# Patient Record
Sex: Female | Born: 1945 | Race: Black or African American | Hispanic: No | Marital: Married
Health system: Southern US, Community
[De-identification: ages and names within clinical notes are randomized; demographics above are authoritative.]

---

## 1998-11-12 ENCOUNTER — Encounter: Payer: Self-pay | Admitting: Emergency Medicine

## 1998-11-12 ENCOUNTER — Emergency Department (HOSPITAL_COMMUNITY): Admission: EM | Admit: 1998-11-12 | Discharge: 1998-11-12 | Payer: Self-pay | Admitting: Emergency Medicine

## 2011-07-02 ENCOUNTER — Other Ambulatory Visit: Payer: Self-pay | Admitting: Internal Medicine

## 2011-07-02 DIAGNOSIS — S158XXA Injury of other specified blood vessels at neck level, initial encounter: Secondary | ICD-10-CM

## 2011-07-03 ENCOUNTER — Ambulatory Visit
Admission: RE | Admit: 2011-07-03 | Discharge: 2011-07-03 | Disposition: A | Discharge status: Home / Self Care | Payer: TRICARE For Life (TFL) | Source: Ambulatory Visit | Attending: Internal Medicine | Admitting: Internal Medicine

## 2011-07-03 DIAGNOSIS — S158XXA Injury of other specified blood vessels at neck level, initial encounter: Secondary | ICD-10-CM

## 2012-10-06 IMAGING — CT CT HEAD W/O CM
2 series · 16 of 30 positions shown, 18 images · non-contrast
Comparison: None.

CLINICAL DATA: Memory loss, prior history of fall, right parietal
headache

CT HEAD WITHOUT CONTRAST
TECHNIQUE: Contiguous axial images were obtained from the base of
the skull through the vertex without contrast.

[Series 2: head w/o · axial · non-contrast · 0.43mm/px · z∈[+17,+125]mm · 8 of 28 slices shown, 10 images]
[im 4/28  brain]
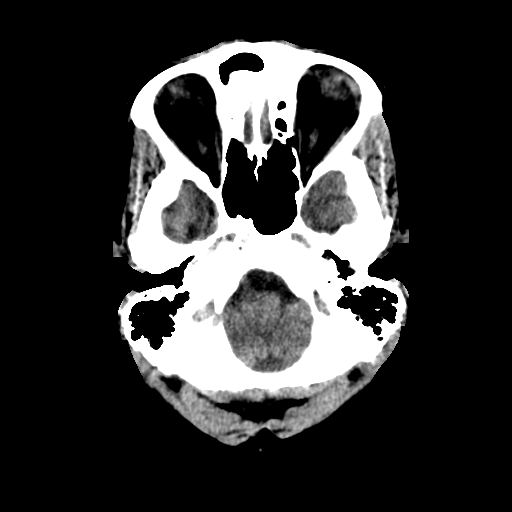
[im 4/28  bone]
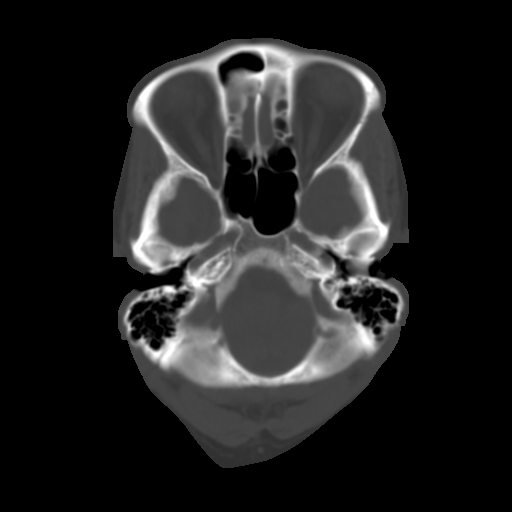
[im 7/28  brain]
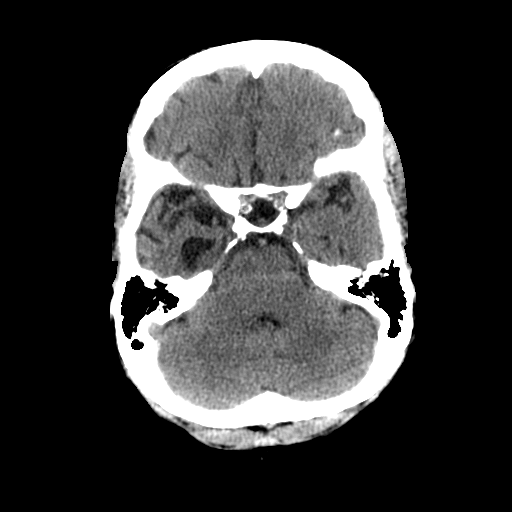
[im 10/28  brain]
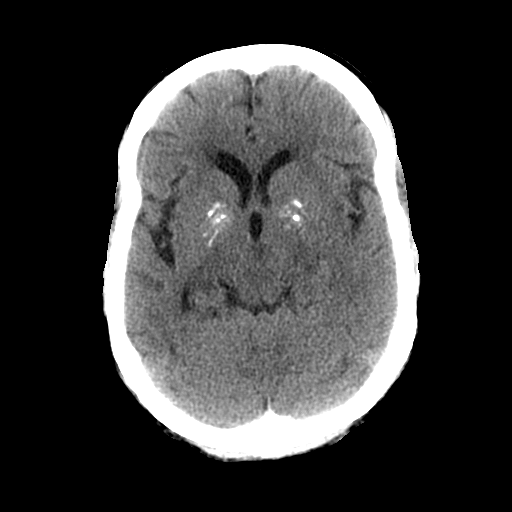
[im 13/28  brain]
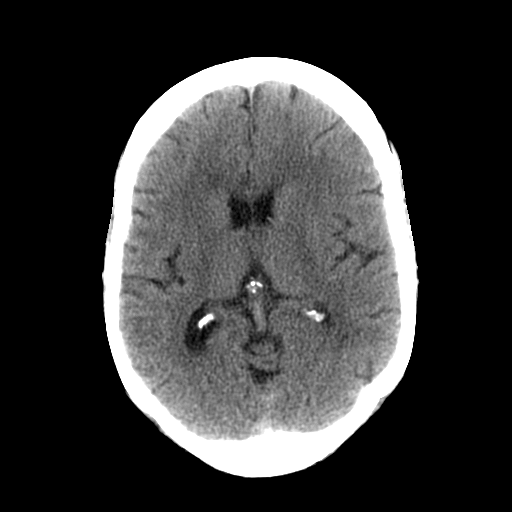
[im 16/28  brain]
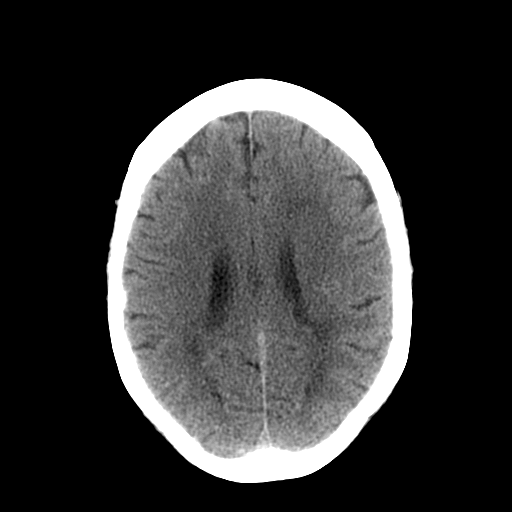
[im 16/28  bone]
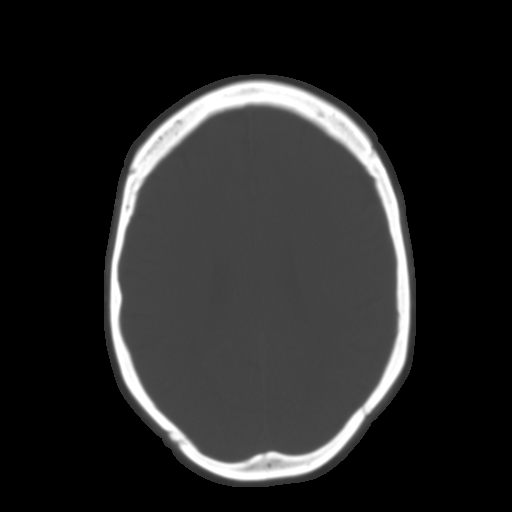
[im 19/28  brain]
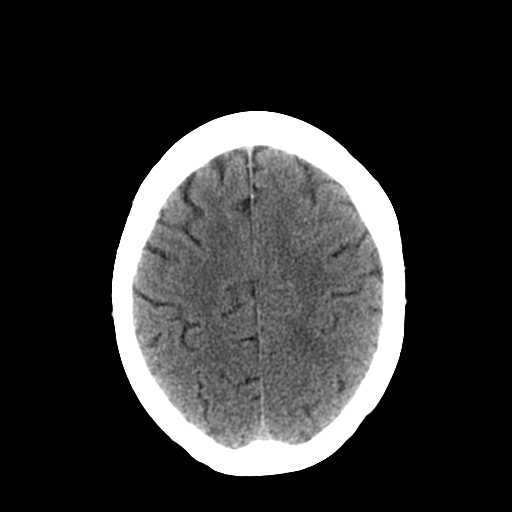
[im 22/28  brain]
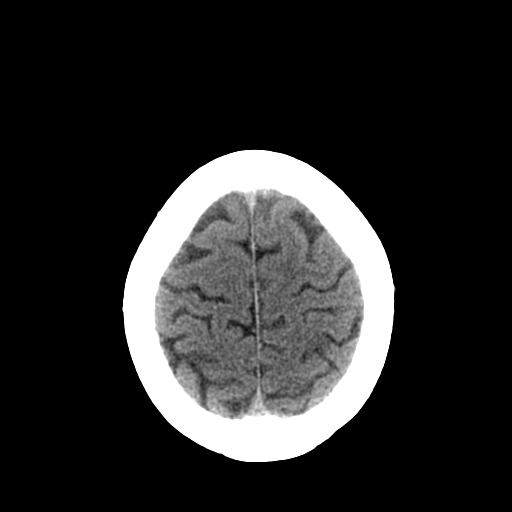
[im 25/28  brain]
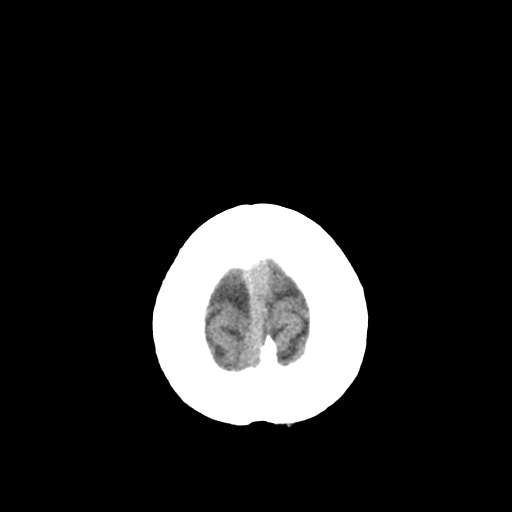

[Series 3: head bone · axial · 0.43mm/px · z∈[+13,+126]mm · 8 of 56 slices shown]
[im 6/56  bone]
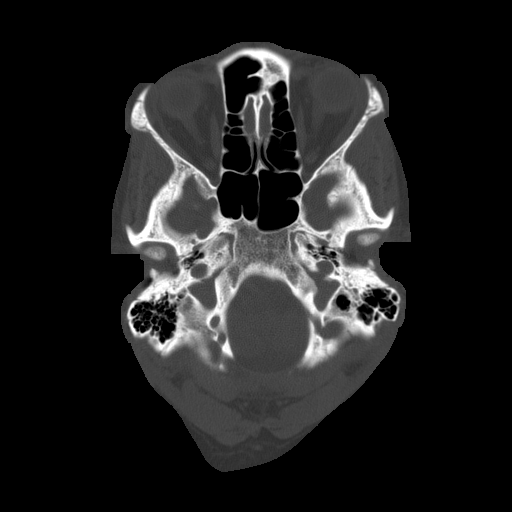
[im 12/56  bone]
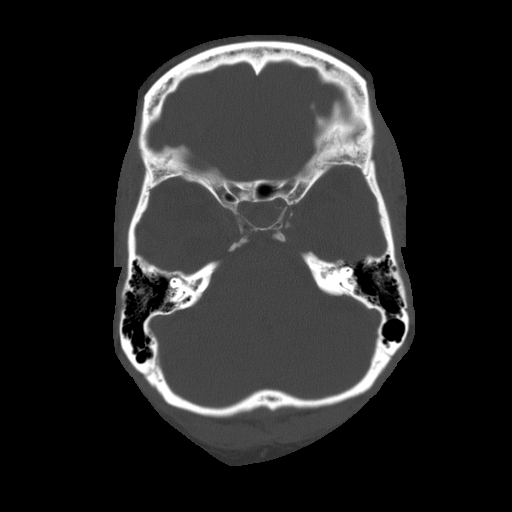
[im 18/56  bone]
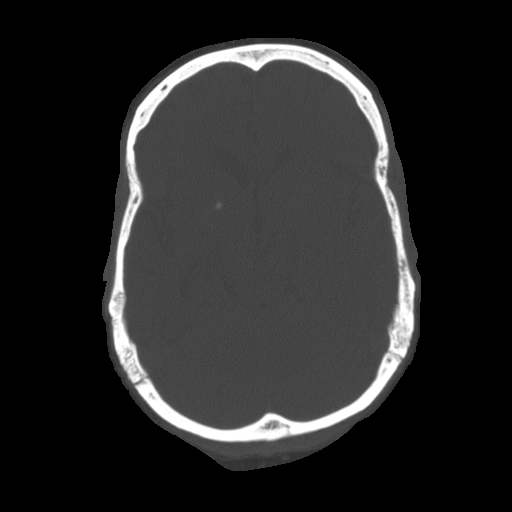
[im 24/56  bone]
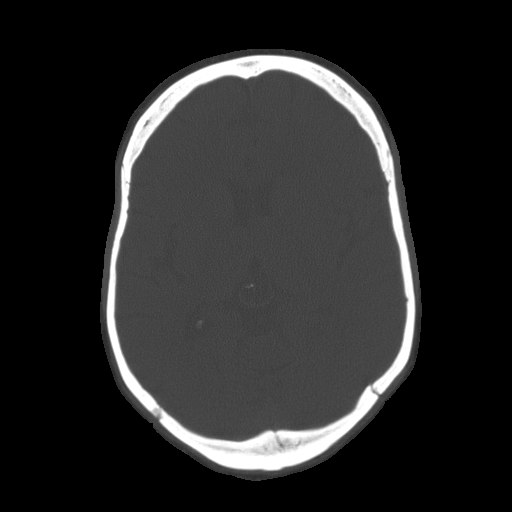
[im 32/56  bone]
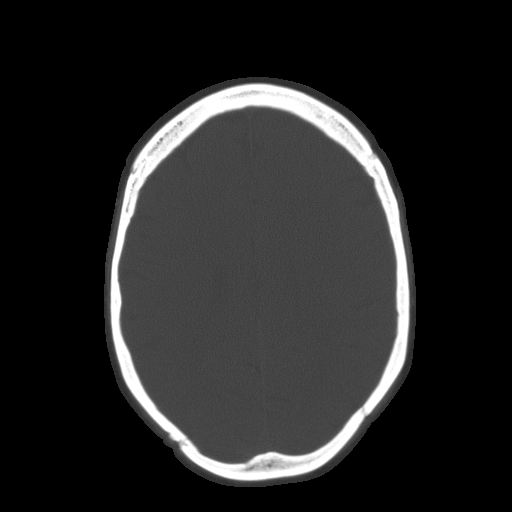
[im 38/56  bone]
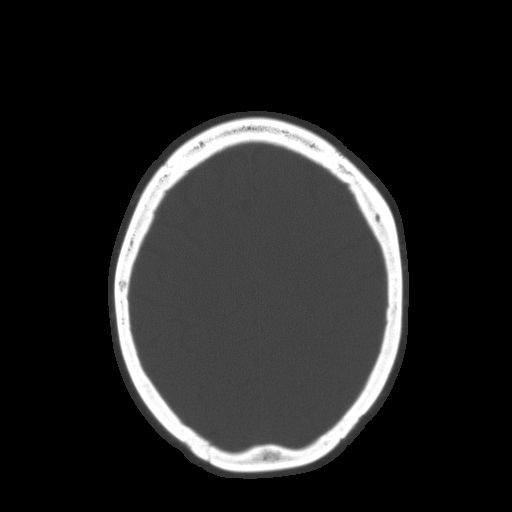
[im 44/56  bone]
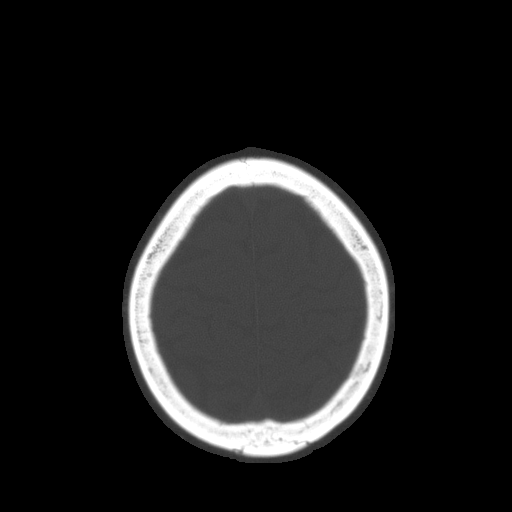
[im 50/56  bone]
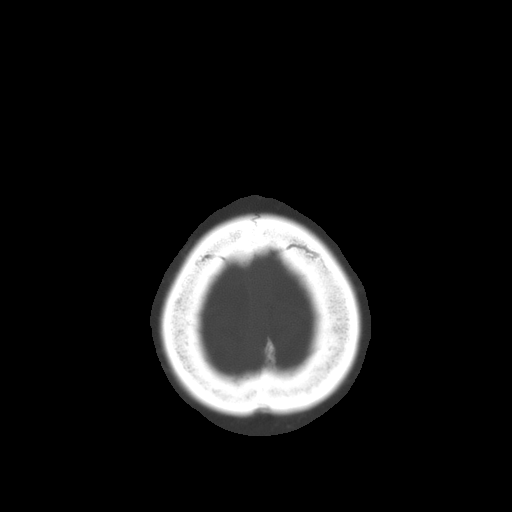

[16 of 30 positions shown; findings below may reference images not displayed]

FINDINGS: No evidence of parenchymal hemorrhage or extra-axial
fluid collection. No mass lesion, mass effect, or midline shift.

No CT evidence of acute infarction.

Encephalomalacic changes/volume loss involving the right temporal
lobe in the right middle cranial fossa (series 2/image 5).

Cerebral volume is age appropriate.  No ventriculomegaly.  Ex vacuo
dilatation of the temporal horn of the right lateral ventricle.

Subcortical white matter and periventricular small vessel ischemic
changes.  Intracranial atherosclerosis.

The visualized paranasal sinuses are essentially clear. The mastoid
air cells are unopacified.

No evidence of calvarial fracture.
IMPRESSION: No evidence of acute intracranial abnormality.

Encephalomalacic changes in the right temporal lobe, likely related
to prior trauma or infarct.

Small vessel ischemic changes with intracranial atherosclerosis.

## 2013-05-04 ENCOUNTER — Emergency Department (HOSPITAL_COMMUNITY)
Admission: EM | Admit: 2013-05-04 | Discharge: 2013-05-04 | Disposition: A | Discharge status: Home / Self Care | Payer: Medicare Other | Attending: Emergency Medicine | Admitting: Emergency Medicine

## 2013-05-04 ENCOUNTER — Emergency Department (HOSPITAL_COMMUNITY): Payer: Medicare Other

## 2013-05-04 DIAGNOSIS — Y9389 Activity, other specified: Secondary | ICD-10-CM | POA: Insufficient documentation

## 2013-05-04 DIAGNOSIS — F29 Unspecified psychosis not due to a substance or known physiological condition: Secondary | ICD-10-CM | POA: Insufficient documentation

## 2013-05-04 DIAGNOSIS — Z043 Encounter for examination and observation following other accident: Secondary | ICD-10-CM | POA: Insufficient documentation

## 2013-05-04 DIAGNOSIS — Y92410 Unspecified street and highway as the place of occurrence of the external cause: Secondary | ICD-10-CM | POA: Insufficient documentation

## 2013-05-04 LAB — CBC WITH DIFFERENTIAL/PLATELET
BASOS ABS: 0 10*3/uL (ref 0.0–0.1)
Basophils Relative: 0 % (ref 0–1)
Eosinophils Absolute: 0.3 10*3/uL (ref 0.0–0.7)
Eosinophils Relative: 4 % (ref 0–5)
HEMATOCRIT: 38.8 % (ref 36.0–46.0)
HEMOGLOBIN: 12.7 g/dL (ref 12.0–15.0)
LYMPHS ABS: 2.8 10*3/uL (ref 0.7–4.0)
Lymphocytes Relative: 36 % (ref 12–46)
MCH: 29 pg (ref 26.0–34.0)
MCHC: 32.7 g/dL (ref 30.0–36.0)
MCV: 88.6 fL (ref 78.0–100.0)
MONOS PCT: 8 % (ref 3–12)
Monocytes Absolute: 0.6 10*3/uL (ref 0.1–1.0)
NEUTROS ABS: 4.1 10*3/uL (ref 1.7–7.7)
Neutrophils Relative %: 52 % (ref 43–77)
Platelets: 291 10*3/uL (ref 150–400)
RBC: 4.38 MIL/uL (ref 3.87–5.11)
RDW: 15 % (ref 11.5–15.5)
WBC: 7.9 10*3/uL (ref 4.0–10.5)

## 2013-05-04 LAB — URINALYSIS, ROUTINE W REFLEX MICROSCOPIC
Bilirubin Urine: NEGATIVE
Glucose, UA: NEGATIVE mg/dL
Hgb urine dipstick: NEGATIVE
Ketones, ur: NEGATIVE mg/dL
Leukocytes, UA: NEGATIVE
NITRITE: NEGATIVE
PH: 8.5 — AB (ref 5.0–8.0)
Protein, ur: NEGATIVE mg/dL
Specific Gravity, Urine: 1.018 (ref 1.005–1.030)
Urobilinogen, UA: 0.2 mg/dL (ref 0.0–1.0)

## 2013-05-04 LAB — COMPREHENSIVE METABOLIC PANEL
ALBUMIN: 3.1 g/dL — AB (ref 3.5–5.2)
ALK PHOS: 72 U/L (ref 39–117)
ALT: 27 U/L (ref 0–35)
AST: 33 U/L (ref 0–37)
BUN: 10 mg/dL (ref 6–23)
CO2: 28 mEq/L (ref 19–32)
Calcium: 8.7 mg/dL (ref 8.4–10.5)
Chloride: 101 mEq/L (ref 96–112)
Creatinine, Ser: 0.65 mg/dL (ref 0.50–1.10)
GFR calc Af Amer: >90 mL/min (ref >90)
GFR calc non Af Amer: >90 mL/min (ref >90)
Glucose, Bld: 97 mg/dL (ref 70–99)
Potassium: 3.4 mEq/L — ABNORMAL LOW (ref 3.7–5.3)
Sodium: 139 mEq/L (ref 137–147)
Total Bilirubin: <0.2 mg/dL — ABNORMAL LOW (ref 0.3–1.2)
Total Protein: 7 g/dL (ref 6.0–8.3)

## 2013-05-04 LAB — RAPID URINE DRUG SCREEN, HOSP PERFORMED
AMPHETAMINES: NOT DETECTED
BARBITURATES: NOT DETECTED
BENZODIAZEPINES: NOT DETECTED
Cocaine: NOT DETECTED
Opiates: NOT DETECTED
Tetrahydrocannabinol: NOT DETECTED

## 2013-05-04 LAB — MAGNESIUM: MAGNESIUM: 1.9 mg/dL (ref 1.5–2.5)

## 2013-05-04 NOTE — ED Notes (Signed)
Returned from CT.  Pt remains confused, daughter at bedside.

## 2013-05-04 NOTE — ED Notes (Signed)
Patient transported to X-ray 
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
## 2013-05-04 NOTE — ED Notes (Signed)
Patient transported to CT 

## 2013-05-04 NOTE — ED Notes (Signed)
Was able to reach PT's daughter and she reports that her mother has early onset dementia and takes namenda. NKDA per daughter and no other diagnoses per daughter

## 2013-05-05 LAB — TSH: TSH: 2.272 u[IU]/mL (ref 0.350–4.500)

## 2014-01-04 ENCOUNTER — Emergency Department (HOSPITAL_COMMUNITY)
Admission: EM | Admit: 2014-01-04 | Discharge: 2014-01-04 | Discharge status: Against Medical Advice | Payer: Medicare Other
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
## 2016-04-30 DEATH — deceased
